# Patient Record
Sex: Female | Born: 1980 | Race: Black or African American | Hispanic: No | Marital: Single | Smoking: Never smoker
Health system: Southern US, Community
[De-identification: ages and names within clinical notes are randomized; demographics above are authoritative.]

## PROBLEM LIST (undated history)

## (undated) DIAGNOSIS — J45909 Unspecified asthma, uncomplicated: Secondary | ICD-10-CM

---

## 2013-01-08 ENCOUNTER — Emergency Department (HOSPITAL_BASED_OUTPATIENT_CLINIC_OR_DEPARTMENT_OTHER): Payer: Self-pay

## 2013-01-08 ENCOUNTER — Encounter (HOSPITAL_BASED_OUTPATIENT_CLINIC_OR_DEPARTMENT_OTHER): Payer: Self-pay | Admitting: Emergency Medicine

## 2013-01-08 ENCOUNTER — Emergency Department (HOSPITAL_BASED_OUTPATIENT_CLINIC_OR_DEPARTMENT_OTHER)
Admission: EM | Admit: 2013-01-08 | Discharge: 2013-01-08 | Disposition: A | Discharge status: Home / Self Care | Payer: Self-pay | Attending: Emergency Medicine | Admitting: Emergency Medicine

## 2013-01-08 DIAGNOSIS — J45909 Unspecified asthma, uncomplicated: Secondary | ICD-10-CM | POA: Insufficient documentation

## 2013-01-08 DIAGNOSIS — R509 Fever, unspecified: Secondary | ICD-10-CM | POA: Insufficient documentation

## 2013-01-08 DIAGNOSIS — R52 Pain, unspecified: Secondary | ICD-10-CM | POA: Insufficient documentation

## 2013-01-08 DIAGNOSIS — J111 Influenza due to unidentified influenza virus with other respiratory manifestations: Secondary | ICD-10-CM | POA: Insufficient documentation

## 2013-01-08 HISTORY — DX: Unspecified asthma, uncomplicated: J45.909

## 2013-01-08 MED ORDER — BENZONATATE 200 MG PO CAPS: 200.0000 mg | ORAL_CAPSULE | Freq: Three times a day (TID) | ORAL | Status: DC

## 2013-01-08 MED ORDER — ACETAMINOPHEN 325 MG PO TABS
ORAL_TABLET | ORAL | Status: AC
  Filled 2013-01-08: qty 2

## 2013-01-08 MED ORDER — ACETAMINOPHEN 325 MG PO TABS
650.0000 mg | ORAL_TABLET | Freq: Once | ORAL | Status: AC
  Administered 2013-01-08: 650 mg via ORAL

## 2013-01-08 MED ORDER — IBUPROFEN 800 MG PO TABS: 800.0000 mg | ORAL_TABLET | Freq: Three times a day (TID) | ORAL | Status: DC

## 2013-01-08 NOTE — ED Notes (Signed)
Karen Sofia, PA-C at bedside 

## 2013-01-08 NOTE — ED Provider Notes (Signed)
Medical screening examination/treatment/procedure(s) were performed by non-physician practitioner and as supervising physician I was immediately available for consultation/collaboration.  EKG Interpretation   None         Shanna Cisco, MD 01/08/13 1538

## 2013-01-08 NOTE — ED Notes (Signed)
Pt reports onset of cough, generalized body aches,fever, and vomiting.  Vomited 2 x in 24 hours.

## 2013-01-08 NOTE — ED Provider Notes (Signed)
CSN: [id number]     Arrival date & time 01/08/13  1219 History   First MD Initiated Contact with Patient 01/08/13 1303     Chief Complaint  Patient presents with  . Cough  . Fever  . Generalized Body Aches   (Consider location/radiation/quality/duration/timing/severity/associated sxs/prior Treatment) Patient is a 32 y.o. female presenting with cough and fever. The history is provided by the patient. No language interpreter was used.  Cough Cough characteristics:  Productive Sputum characteristics:  Nondescript Severity:  Moderate Onset quality:  Sudden Duration:  1 day Timing:  Constant Progression:  Worsening Chronicity:  New Relieved by:  Nothing Worsened by:  Nothing tried Ineffective treatments:  None tried Associated symptoms: fever   Fever Associated symptoms: cough     Past Medical History  Diagnosis Date  . Asthma    History reviewed. No pertinent past surgical history. No family history on file. History  Substance Use Topics  . Smoking status: Never Smoker   . Smokeless tobacco: Not on file  . Alcohol Use: Yes     Comment: occasional   OB History   Grav Para Term Preterm Abortions TAB SAB Ect Mult Living                 Review of Systems  Constitutional: Positive for fever.  Respiratory: Positive for cough.   All other systems reviewed and are negative.    Allergies  Asa  Home Medications   Current Outpatient Rx  Name  Route  Sig  Dispense  Refill  . benzonatate (TESSALON) 200 MG capsule   Oral   Take 1 capsule (200 mg total) by mouth every 8 (eight) hours.   21 capsule   0   . ibuprofen (ADVIL,MOTRIN) 800 MG tablet   Oral   Take 1 tablet (800 mg total) by mouth 3 (three) times daily.   21 tablet   0    BP 130/88  Pulse 118  Temp(Src) 99.3 F (37.4 C) (Oral)  Resp 16  Ht 5\' 8"  (1.727 m)  Wt 150 lb (68.04 kg)  BMI 22.81 kg/m2  SpO2 100%  LMP 12/24/2012 Physical Exam  Nursing note and vitals reviewed. Constitutional: She is  oriented to person, place, and time. She appears well-developed and well-nourished.  HENT:  Head: Normocephalic and atraumatic.  Right Ear: External ear normal.  Left Ear: External ear normal.  Eyes: Conjunctivae and EOM are normal. Pupils are equal, round, and reactive to light.  Neck: Normal range of motion.  Cardiovascular: Normal rate, regular rhythm and normal heart sounds.   Pulmonary/Chest: Effort normal and breath sounds normal.  Abdominal: Soft. She exhibits no distension.  Musculoskeletal: Normal range of motion.  Neurological: She is alert and oriented to person, place, and time.  Skin: Skin is warm.  Psychiatric: She has a normal mood and affect.    ED Course  Procedures (including critical care time) Labs Review Labs Reviewed - No data to display Imaging Review No results found.  EKG Interpretation   None       MDM   1. Influenza        Elson Areas, PA-C 01/08/13 1329  Lonia Skinner Dunn, PA-C 01/08/13 1329

## 2013-01-08 NOTE — ED Notes (Signed)
Patient transported to X-ray 

## 2014-11-16 ENCOUNTER — Emergency Department (HOSPITAL_BASED_OUTPATIENT_CLINIC_OR_DEPARTMENT_OTHER)
Admission: EM | Admit: 2014-11-16 | Discharge: 2014-11-16 | Disposition: A | Discharge status: Home / Self Care | Payer: No Typology Code available for payment source | Attending: Emergency Medicine | Admitting: Emergency Medicine

## 2014-11-16 ENCOUNTER — Encounter (HOSPITAL_BASED_OUTPATIENT_CLINIC_OR_DEPARTMENT_OTHER): Payer: Self-pay | Admitting: *Deleted

## 2014-11-16 DIAGNOSIS — Y998 Other external cause status: Secondary | ICD-10-CM | POA: Diagnosis not present

## 2014-11-16 DIAGNOSIS — Y9389 Activity, other specified: Secondary | ICD-10-CM | POA: Insufficient documentation

## 2014-11-16 DIAGNOSIS — Y92410 Unspecified street and highway as the place of occurrence of the external cause: Secondary | ICD-10-CM | POA: Insufficient documentation

## 2014-11-16 DIAGNOSIS — S79911A Unspecified injury of right hip, initial encounter: Secondary | ICD-10-CM | POA: Insufficient documentation

## 2014-11-16 DIAGNOSIS — Z79899 Other long term (current) drug therapy: Secondary | ICD-10-CM | POA: Diagnosis not present

## 2014-11-16 DIAGNOSIS — M545 Low back pain, unspecified: Secondary | ICD-10-CM

## 2014-11-16 DIAGNOSIS — S3992XA Unspecified injury of lower back, initial encounter: Secondary | ICD-10-CM | POA: Insufficient documentation

## 2014-11-16 DIAGNOSIS — Z791 Long term (current) use of non-steroidal anti-inflammatories (NSAID): Secondary | ICD-10-CM | POA: Insufficient documentation

## 2014-11-16 DIAGNOSIS — J45909 Unspecified asthma, uncomplicated: Secondary | ICD-10-CM | POA: Diagnosis not present

## 2014-11-16 DIAGNOSIS — M25551 Pain in right hip: Secondary | ICD-10-CM

## 2014-11-16 MED ORDER — METHOCARBAMOL 500 MG PO TABS: 500.0000 mg | ORAL_TABLET | Freq: Two times a day (BID) | ORAL | Status: AC | PRN

## 2014-11-16 MED ORDER — TRAMADOL HCL 50 MG PO TABS: 50.0000 mg | ORAL_TABLET | Freq: Four times a day (QID) | ORAL | Status: AC | PRN

## 2014-11-16 MED ORDER — IBUPROFEN 800 MG PO TABS: 800.0000 mg | ORAL_TABLET | Freq: Three times a day (TID) | ORAL | Status: DC

## 2014-11-16 NOTE — Discharge Instructions (Signed)
Back Pain, Adult Back pain is very common. The pain often gets better over time. The cause of back pain is usually not dangerous. Most people can learn to manage their back pain on their own.  HOME CARE  Watch your back pain for any changes. The following actions may help to lessen any pain you are feeling:  Stay active. Start with short walks on flat ground if you can. Try to walk farther each day.  Exercise regularly as told by your doctor. Exercise helps your back heal faster. It also helps avoid future injury by keeping your muscles strong and flexible.  Do not sit, drive, or stand in one place for more than 30 minutes.  Do not stay in bed. Resting more than 1-2 days can slow down your recovery.  Be careful when you bend or lift an object. Use good form when lifting:  Bend at your knees.  Keep the object close to your body.  Do not twist.  Sleep on a firm mattress. Lie on your side, and bend your knees. If you lie on your back, put a pillow under your knees.  Take medicines only as told by your doctor.  Put ice on the injured area.  Put ice in a plastic bag.  Place a towel between your skin and the bag.  Leave the ice on for 20 minutes, 2-3 times a day for the first 2-3 days. After that, you can switch between ice and heat packs.  Avoid feeling anxious or stressed. Find good ways to deal with stress, such as exercise.  Maintain a healthy weight. Extra weight puts stress on your back. GET HELP IF:   You have pain that does not go away with rest or medicine.  You have worsening pain that goes down into your legs or buttocks.  You have pain that does not get better in one week.  You have pain at night.  You lose weight.  You have a fever or chills. GET HELP RIGHT AWAY IF:   You cannot control when you poop (bowel movement) or pee (urinate).  Your arms or legs feel weak.  Your arms or legs lose feeling (numbness).  You feel sick to your stomach (nauseous) or  throw up (vomit).  You have belly (abdominal) pain.  You feel like you may pass out (faint).   This information is not intended to replace advice given to you by your health care provider. Make sure you discuss any questions you have with your health care provider.   Document Released: 06/14/2007 Document Revised: 01/16/2014 Document Reviewed: 04/29/2013 Elsevier Interactive Patient Education 2016 Elsevier Inc.  Cryotherapy Cryotherapy means treatment with cold. Ice or gel packs can be used to reduce both pain and swelling. Ice is the most helpful within the first 24 to 48 hours after an injury or flare-up from overusing a muscle or joint. Sprains, strains, spasms, burning pain, shooting pain, and aches can all be eased with ice. Ice can also be used when recovering from surgery. Ice is effective, has very few side effects, and is safe for most people to use. PRECAUTIONS  Ice is not a safe treatment option for people with:  Raynaud phenomenon. This is a condition affecting small blood vessels in the extremities. Exposure to cold may cause your problems to return.  Cold hypersensitivity. There are many forms of cold hypersensitivity, including:  Cold urticaria. Red, itchy hives appear on the skin when the tissues begin to warm after being iced.  Cold erythema. This is a red, itchy rash caused by exposure to cold.  Cold hemoglobinuria. Red blood cells break down when the tissues begin to warm after being iced. The hemoglobin that carry oxygen are passed into the urine because they cannot combine with blood proteins fast enough.  Numbness or altered sensitivity in the area being iced. If you have any of the following conditions, do not use ice until you have discussed cryotherapy with your caregiver:  Heart conditions, such as arrhythmia, angina, or chronic heart disease.  High blood pressure.  Healing wounds or open skin in the area being iced.  Current infections.  Rheumatoid  arthritis.  Poor circulation.  Diabetes. Ice slows the blood flow in the region it is applied. This is beneficial when trying to stop inflamed tissues from spreading irritating chemicals to surrounding tissues. However, if you expose your skin to cold temperatures for too long or without the proper protection, you can damage your skin or nerves. Watch for signs of skin damage due to cold. HOME CARE INSTRUCTIONS Follow these tips to use ice and cold packs safely.  Place a dry or damp towel between the ice and skin. A damp towel will cool the skin more quickly, so you may need to shorten the time that the ice is used.  For a more rapid response, add gentle compression to the ice.  Ice for no more than 10 to 20 minutes at a time. The bonier the area you are icing, the less time it will take to get the benefits of ice.  Check your skin after 5 minutes to make sure there are no signs of a poor response to cold or skin damage.  Rest 20 minutes or more between uses.  Once your skin is numb, you can end your treatment. You can test numbness by very lightly touching your skin. The touch should be so light that you do not see the skin dimple from the pressure of your fingertip. When using ice, most people will feel these normal sensations in this order: cold, burning, aching, and numbness.  Do not use ice on someone who cannot communicate their responses to pain, such as small children or people with dementia. HOW TO MAKE AN ICE PACK Ice packs are the most common way to use ice therapy. Other methods include ice massage, ice baths, and cryosprays. Muscle creams that cause a cold, tingly feeling do not offer the same benefits that ice offers and should not be used as a substitute unless recommended by your caregiver. To make an ice pack, do one of the following:  Place crushed ice or a bag of frozen vegetables in a sealable plastic bag. Squeeze out the excess air. Place this bag inside another plastic  bag. Slide the bag into a pillowcase or place a damp towel between your skin and the bag.  Mix 3 parts water with 1 part rubbing alcohol. Freeze the mixture in a sealable plastic bag. When you remove the mixture from the freezer, it will be slushy. Squeeze out the excess air. Place this bag inside another plastic bag. Slide the bag into a pillowcase or place a damp towel between your skin and the bag. SEEK MEDICAL CARE IF:  You develop white spots on your skin. This may give the skin a blotchy (mottled) appearance.  Your skin turns blue or pale.  Your skin becomes waxy or hard.  Your swelling gets worse. MAKE SURE YOU:   Understand these instructions.  Will  watch your condition.  Will get help right away if you are not doing well or get worse.   This information is not intended to replace advice given to you by your health care provider. Make sure you discuss any questions you have with your health care provider.   Document Released: 08/22/2010 Document Revised: 01/16/2014 Document Reviewed: 08/22/2010 Elsevier Interactive Patient Education 2016 Elsevier Inc.  [employer] Therapy Heat therapy can help ease sore, stiff, injured, and tight muscles and joints. Heat relaxes your muscles, which may help ease your pain.  RISKS AND COMPLICATIONS If you have any of the following conditions, do not use heat therapy unless your health care provider has approved:  Poor circulation.  Healing wounds or scarred skin in the area being treated.  Diabetes, heart disease, or high blood pressure.  Not being able to feel (numbness) the area being treated.  Unusual swelling of the area being treated.  Active infections.  Blood clots.  Cancer.  Inability to communicate pain. This may include young children and people who have problems with their brain function (dementia).  Pregnancy. Heat therapy should only be used on old, pre-existing, or long-lasting (chronic) injuries. Do not use heat  therapy on new injuries unless directed by your health care provider. HOW TO USE HEAT THERAPY There are several different kinds of heat therapy, including:  Moist heat pack.  Warm water bath.  Hot water bottle.  Electric heating pad.  Heated gel pack.  Heated wrap.  Electric heating pad. Use the heat therapy method suggested by your health care provider. Follow your health care provider's instructions on when and how to use heat therapy. GENERAL HEAT THERAPY RECOMMENDATIONS  Do not sleep while using heat therapy. Only use heat therapy while you are awake.  Your skin may turn pink while using heat therapy. Do not use heat therapy if your skin turns red.  Do not use heat therapy if you have new pain.  High heat or long exposure to heat can cause burns. Be careful when using heat therapy to avoid burning your skin.  Do not use heat therapy on areas of your skin that are already irritated, such as with a rash or sunburn. SEEK MEDICAL CARE IF:  You have blisters, redness, swelling, or numbness.  You have new pain.  Your pain is worse. MAKE SURE YOU:  Understand these instructions.  Will watch your condition.  Will get help right away if you are not doing well or get worse.   This information is not intended to replace advice given to you by your health care provider. Make sure you discuss any questions you have with your health care provider.   Document Released: 03/20/2011 Document Revised: 01/16/2014 Document Reviewed: 02/18/2013 Elsevier Interactive Patient Education 2016 Elsevier Inc.  Hip Pain Your hip is the joint between your upper legs and your lower pelvis. The bones, cartilage, tendons, and muscles of your hip joint perform a lot of work each day supporting your body weight and allowing you to move around. Hip pain can range from a minor ache to severe pain in one or both of your hips. Pain may be felt on the inside of the hip joint near the groin, or the  outside near the buttocks and upper thigh. You may have swelling or stiffness as well.  HOME CARE INSTRUCTIONS   Take medicines only as directed by your health care provider.  Apply ice to the injured area:  Put ice in a plastic bag.  Place a towel between your skin and the bag.  Leave the ice on for 15-20 minutes at a time, 3-4 times a day.  Keep your leg raised (elevated) when possible to lessen swelling.  Avoid activities that cause pain.  Follow specific exercises as directed by your health care provider.  Sleep with a pillow between your legs on your most comfortable side.  Record how often you have hip pain, the location of the pain, and what it feels like. SEEK MEDICAL CARE IF:   You are unable to put weight on your leg.  Your hip is red or swollen or very tender to touch.  Your pain or swelling continues or worsens after 1 week.  You have increasing difficulty walking.  You have a fever. SEEK IMMEDIATE MEDICAL CARE IF:   You have fallen.  You have a sudden increase in pain and swelling in your hip. MAKE SURE YOU:   Understand these instructions.  Will watch your condition.  Will get help right away if you are not doing well or get worse.   This information is not intended to replace advice given to you by your health care provider. Make sure you discuss any questions you have with your health care provider.   Document Released: 06/15/2009 Document Revised: 01/16/2014 Document Reviewed: 08/22/2012 Elsevier Interactive Patient Education 2016 [employer].  [employer]ist information centre manager It is common to have multiple bruises and sore muscles after a motor vehicle collision (MVC). These tend to feel worse for the first 24 hours. You may have the most stiffness and soreness over the first several hours. You may also feel worse when you wake up the first morning after your collision. After this point, you will usually begin to improve with each day. The speed of  improvement often depends on the severity of the collision, the number of injuries, and the location and nature of these injuries. HOME CARE INSTRUCTIONS  Put ice on the injured area.  Put ice in a plastic bag.  Place a towel between your skin and the bag.  Leave the ice on for 15-20 minutes, 3-4 times a day, or as directed by your health care provider.  Drink enough fluids to keep your urine clear or pale yellow. Do not drink alcohol.  Take a warm shower or bath once or twice a day. This will increase blood flow to sore muscles.  You may return to activities as directed by your caregiver. Be careful when lifting, as this may aggravate neck or back pain.  Only take over-the-counter or prescription medicines for pain, discomfort, or fever as directed by your caregiver. Do not use aspirin. This may increase bruising and bleeding. SEEK IMMEDIATE MEDICAL CARE IF:  You have numbness, tingling, or weakness in the arms or legs.  You develop severe headaches not relieved with medicine.  You have severe neck pain, especially tenderness in the middle of the back of your neck.  You have changes in bowel or bladder control.  There is increasing pain in any area of the body.  You have shortness of breath, light-headedness, dizziness, or fainting.  You have chest pain.  You feel sick to your stomach (nauseous), throw up (vomit), or sweat.  You have increasing abdominal discomfort.  There is blood in your urine, stool, or vomit.  You have pain in your shoulder (shoulder strap areas).  You feel your symptoms are getting worse. MAKE SURE YOU:  Understand these instructions.  Will watch your condition.  Will get  help right away if you are not doing well or get worse.   This information is not intended to replace advice given to you by your health care provider. Make sure you discuss any questions you have with your health care provider.   Document Released: 12/26/2004 Document  Revised: 01/16/2014 Document Reviewed: 05/25/2010 Elsevier Interactive Patient Education [employer] Elsevier Inc.
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
## 2015-08-19 IMAGING — CR DG CHEST 2V
2 series · 2 of 2 positions shown · non-contrast
Comparison: None.

CLINICAL DATA: 32-year-old female with cough fever and body ache.
Initial encounter.

EXAM:
CHEST  2 VIEW

[w chest pa]
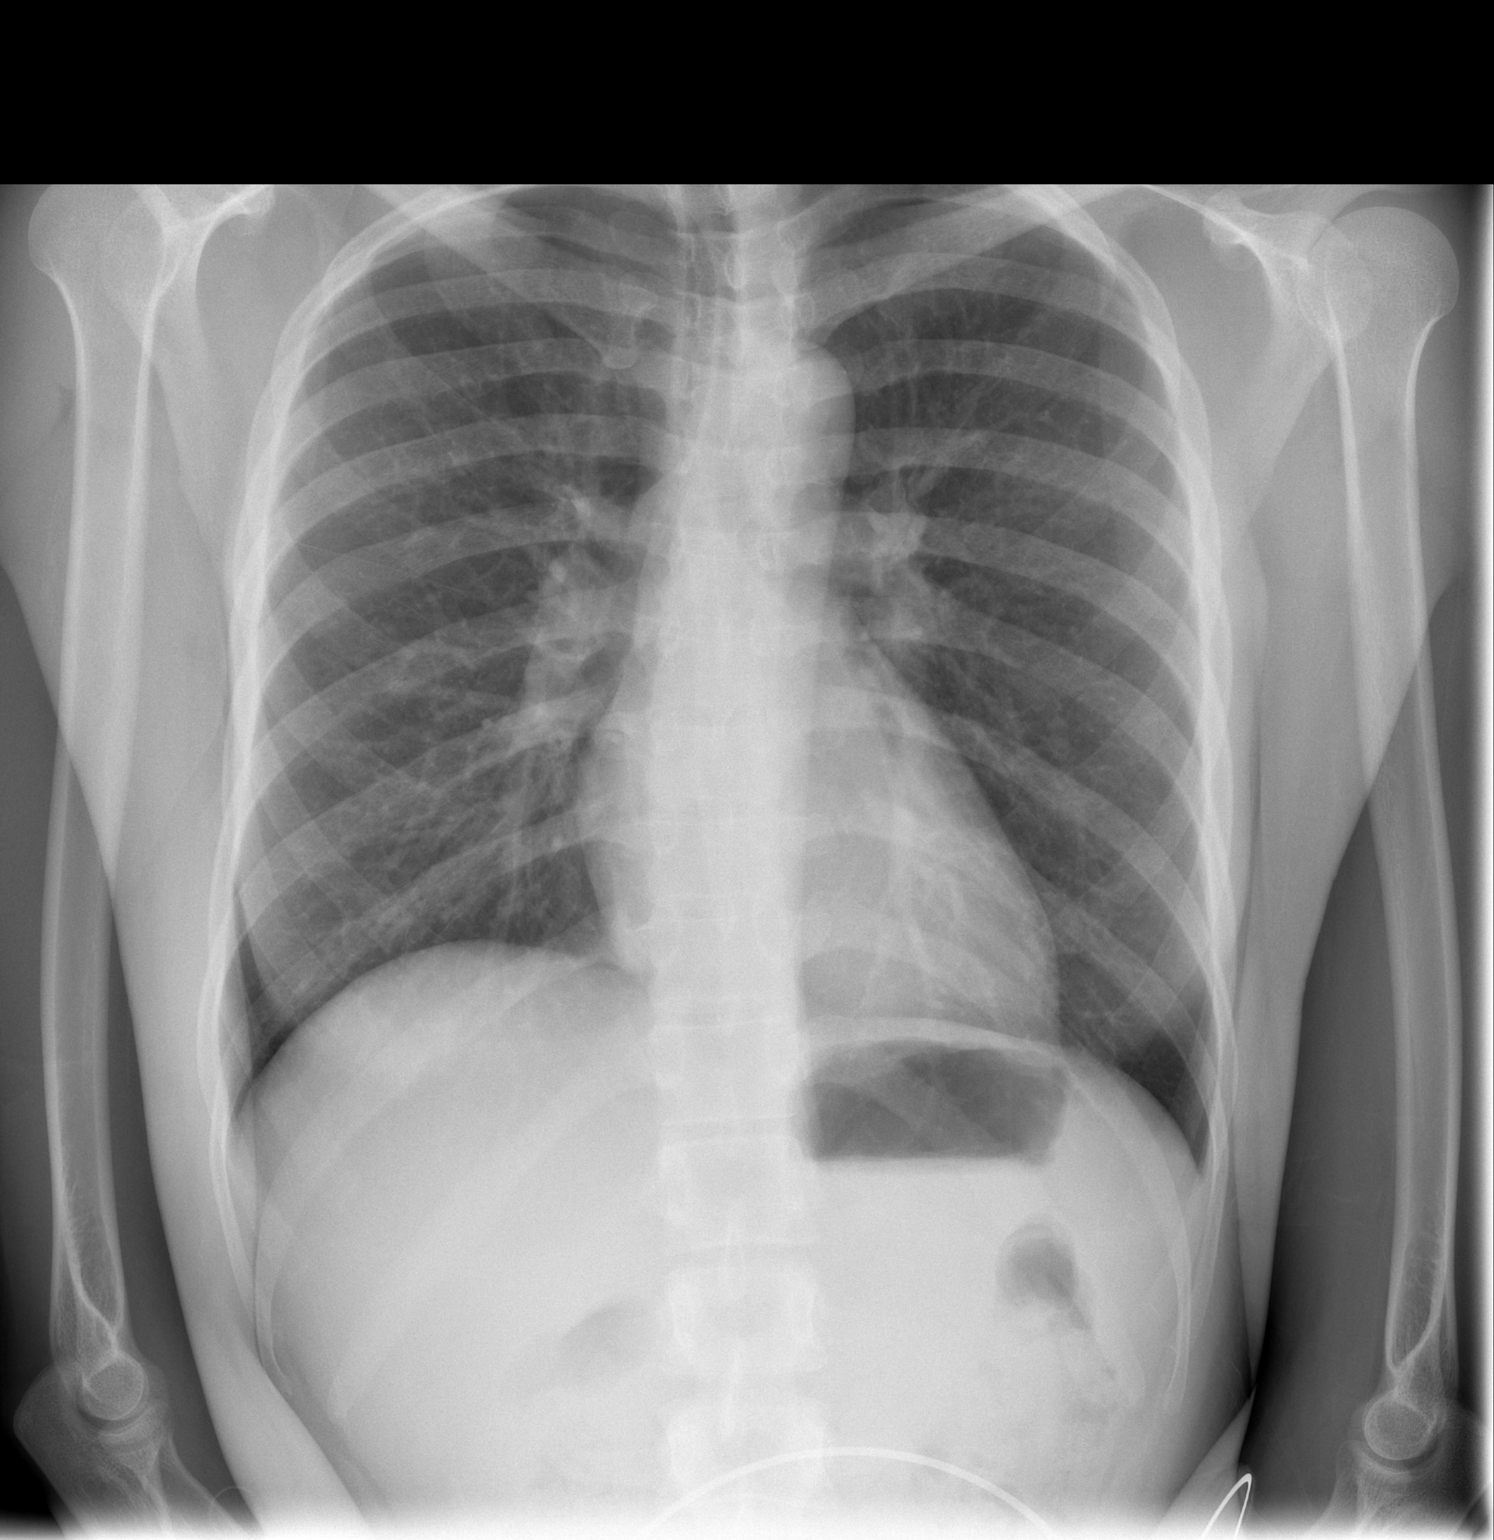

[w chest lat]
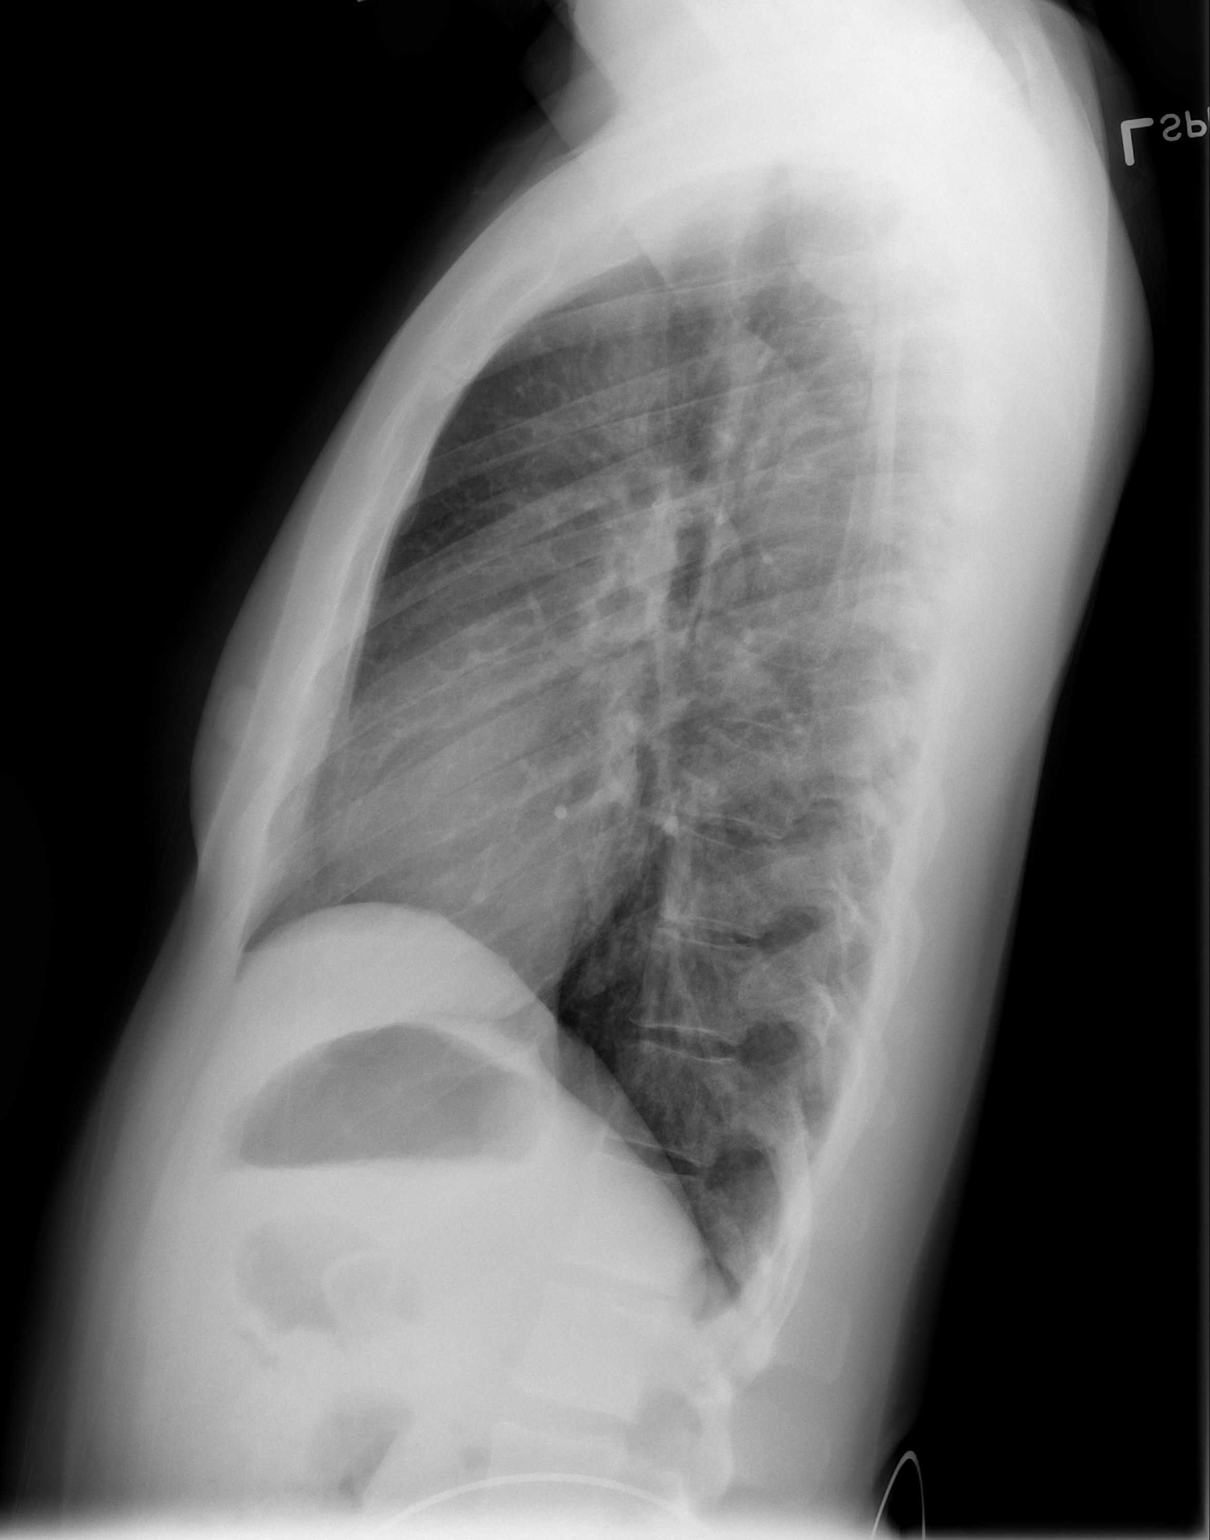

[2 of 2 positions shown; findings below may reference images not displayed]

FINDINGS: Lung volumes are within normal limits, mild elevation of the right
hemidiaphragm. Normal cardiac size and mediastinal contours.
Visualized tracheal air column is within normal limits. The lungs
are clear. No pneumothorax or effusion. Mild scoliosis. No acute
osseous abnormality identified.
IMPRESSION: No acute cardiopulmonary abnormality.

## 2020-09-07 ENCOUNTER — Encounter (HOSPITAL_BASED_OUTPATIENT_CLINIC_OR_DEPARTMENT_OTHER): Payer: Self-pay | Admitting: Emergency Medicine

## 2020-09-07 ENCOUNTER — Other Ambulatory Visit: Payer: Self-pay

## 2020-09-07 ENCOUNTER — Emergency Department (HOSPITAL_BASED_OUTPATIENT_CLINIC_OR_DEPARTMENT_OTHER)
Admission: EM | Admit: 2020-09-07 | Discharge: 2020-09-07 | Disposition: A | Discharge status: Home / Self Care | Payer: Commercial Managed Care - PPO | Attending: Emergency Medicine | Admitting: Emergency Medicine

## 2020-09-07 DIAGNOSIS — W57XXXA Bitten or stung by nonvenomous insect and other nonvenomous arthropods, initial encounter: Secondary | ICD-10-CM | POA: Diagnosis not present

## 2020-09-07 DIAGNOSIS — Z7951 Long term (current) use of inhaled steroids: Secondary | ICD-10-CM | POA: Insufficient documentation

## 2020-09-07 DIAGNOSIS — J45909 Unspecified asthma, uncomplicated: Secondary | ICD-10-CM | POA: Insufficient documentation

## 2020-09-07 DIAGNOSIS — S90561A Insect bite (nonvenomous), right ankle, initial encounter: Secondary | ICD-10-CM | POA: Diagnosis not present

## 2020-09-07 MED ORDER — BACITRACIN ZINC 500 UNIT/GM EX OINT: 1.0000 "application " | TOPICAL_OINTMENT | Freq: Two times a day (BID) | CUTANEOUS | 0 refills | Status: AC

## 2020-09-07 NOTE — Discharge Instructions (Signed)
Wash 2 times a day and apply antibiotic ointment after washing for the next 5-7 day.  Use tylenol, ibuprofen, and ice as needed.  Return to the ER if you develop fever, thick white pus draining, severe worsening pain, redness streaking up your leg, or any new, worsening, or concerning symptoms.

## 2020-09-07 NOTE — ED Triage Notes (Signed)
Reports right ankle pain.  Has flea bites to the ankle for the last week that has now gotten painful.  Reports some drainage.  Blister noted to area.

## 2020-09-07 NOTE — ED Provider Notes (Signed)
[facility] EMERGENCY DEPARTMENT Provider Note   CSN: [id number] Arrival date & time: 09/07/20  1126     History Chief Complaint  Patient presents with   Insect Bite    Teresa Williams is a 40 y.o. female presenting for evaluation of insect bite.   Pt states 7-10 days ago she sustained flea bites of her feet and ankles bilaterally. Multiple other family members got bites at the same time. Over the past few days, one lesion has grown more swollen and painful on her R ankle. No drainage. No fevers, chills, numbness, weakness, streaking. No recent prodromal symptoms. She is sexually active with 1 female partner who is symptom free. She has a h/o asthma for which she uses albuterol prn, no other medical problems. She has had impetigo following insect bites multiple times in the past.   HPI     Past Medical History:  Diagnosis Date   Asthma     There are no problems to display for this patient.   History reviewed. No pertinent surgical history.   OB History   No obstetric history on file.     No family history on file.  Social History   Tobacco Use   Smoking status: Never  Substance Use Topics   Alcohol use: Yes    Comment: occasional   Drug use: No    Home Medications Prior to Admission medications   Medication Sig Start Date End Date Taking? Authorizing Provider  bacitracin ointment Apply 1 application topically 2 (two) times daily. 09/07/20  Yes Chrystopher Stangl, PA-C  ALBUTEROL IN Inhale into the lungs.    [provider]  benzonatate (TESSALON) 200 MG capsule Take 1 capsule (200 mg total) by mouth every 8 (eight) hours. 01/08/13   Elson Areas, PA-C  ibuprofen (ADVIL,MOTRIN) 800 MG tablet Take 1 tablet (800 mg total) by mouth 3 (three) times daily. 11/16/14   Danelle Berry, PA-C  methocarbamol (ROBAXIN) 500 MG tablet Take 1 tablet (500 mg total) by mouth 2 (two) times daily as needed for muscle spasms. 11/16/14   Danelle Berry, PA-C  traMADol  (ULTRAM) 50 MG tablet Take 1 tablet (50 mg total) by mouth every 6 (six) hours as needed for severe pain. 11/16/14   Danelle Berry, PA-C    Allergies    Asa [aspirin]  Review of Systems   Review of Systems  Constitutional:  Negative for fever.  Skin:  Positive for rash.   Physical Exam Updated Vital Signs BP 118/79 (BP Location: Left Arm)   Pulse 80   Temp 98.5 F (36.9 C) (Oral)   Resp 16   Ht 5\' 8"  (1.727 m)   Wt 79.4 kg   SpO2 100%   BMI 26.61 kg/m   Physical Exam Vitals and nursing note reviewed.  Constitutional:      General: She is not in acute distress.    Appearance: She is well-developed.  HENT:     Head: Normocephalic and atraumatic.  Eyes:     Extraocular Movements: Extraocular movements intact.  Cardiovascular:     Rate and Rhythm: Normal rate.  Pulmonary:     Effort: Pulmonary effort is normal.  Abdominal:     General: There is no distension.  Musculoskeletal:        General: Normal range of motion.     Cervical back: Normal range of motion.  Skin:    General: Skin is warm.     Findings: Rash present.     Comments: See  pictures below. Blister of posterior R ankle, no surrounding erythema or induration. No active draiage. Multiple other discrete lesions c/w insect bites.   Neurological:     Mental Status: She is alert and oriented to person, place, and time.         ED Results / Procedures / Treatments   Labs (all labs ordered are listed, but only abnormal results are displayed) Labs Reviewed - No data to display  EKG None  Radiology No results found.  Procedures Procedures   Medications Ordered in ED Medications - No data to display  ED Course  I have reviewed the triage vital signs and the nursing notes.  Pertinent labs & imaging results that were available during my care of the patient were reviewed by me and considered in my medical decision making (see chart for details).    MDM Rules/Calculators/A&P                            Pt presenting for insect bite. On exam, pt appears nontoxic. Blister on the R heel. Blister was sterilely unroofed, and drained serous fluids. No streaking. Discussed wound care, bacitracin given. Rash not c/w with monkeypox, and pt without risk factors. No c/w RPR, SJS, RMSF. At this time, pt appears safe for d/c. Return precautions given. Pt states she understands and agrees to plan.    Final Clinical Impression(s) / ED Diagnoses Final diagnoses:  Insect bite of right ankle, initial encounter    Rx / DC Orders ED Discharge Orders          Ordered    bacitracin ointment  2 times daily        09/07/20 1223             Alveria Apley, PA-C 09/07/20 1231    Eber Hong, MD 09/18/20 [phone]

## 2020-12-06 ENCOUNTER — Encounter (HOSPITAL_BASED_OUTPATIENT_CLINIC_OR_DEPARTMENT_OTHER): Payer: Self-pay | Admitting: Urology

## 2020-12-06 ENCOUNTER — Other Ambulatory Visit: Payer: Self-pay

## 2020-12-06 ENCOUNTER — Emergency Department (HOSPITAL_BASED_OUTPATIENT_CLINIC_OR_DEPARTMENT_OTHER)
Admission: EM | Admit: 2020-12-06 | Discharge: 2020-12-06 | Disposition: A | Discharge status: Home / Self Care | Payer: Commercial Managed Care - PPO | Attending: Emergency Medicine | Admitting: Emergency Medicine

## 2020-12-06 DIAGNOSIS — J45909 Unspecified asthma, uncomplicated: Secondary | ICD-10-CM | POA: Diagnosis not present

## 2020-12-06 DIAGNOSIS — Z20822 Contact with and (suspected) exposure to covid-19: Secondary | ICD-10-CM | POA: Diagnosis not present

## 2020-12-06 DIAGNOSIS — J101 Influenza due to other identified influenza virus with other respiratory manifestations: Secondary | ICD-10-CM | POA: Insufficient documentation

## 2020-12-06 DIAGNOSIS — R509 Fever, unspecified: Secondary | ICD-10-CM | POA: Diagnosis present

## 2020-12-06 LAB — RESP PANEL BY RT-PCR (FLU A&B, COVID) ARPGX2
Influenza A by PCR: POSITIVE — AB
Influenza B by PCR: NEGATIVE
SARS Coronavirus 2 by RT PCR: NEGATIVE

## 2020-12-06 MED ORDER — BENZONATATE 100 MG PO CAPS: 100.0000 mg | ORAL_CAPSULE | Freq: Three times a day (TID) | ORAL | 0 refills | Status: AC | PRN

## 2020-12-06 MED ORDER — IBUPROFEN 800 MG PO TABS: 800.0000 mg | ORAL_TABLET | Freq: Three times a day (TID) | ORAL | 0 refills | Status: AC | PRN

## 2020-12-06 MED ORDER — FLUTICASONE PROPIONATE 50 MCG/ACT NA SUSP: 1.0000 | Freq: Every day | NASAL | 0 refills | Status: AC

## 2020-12-06 NOTE — ED Provider Notes (Signed)
Emergency Department Provider Note   I have reviewed the triage vital signs and the nursing notes.   HISTORY  Chief Complaint flu like symptoms   HPI Teresa Williams is a 40 y.o. female with PMH of asthma presents to the emergency department for evaluation of 3 days of fever along with chills and body aches.  Has had some associated cough.  Reports fever at home of 102.  Has been treating with Tylenol.  No abdominal pain, vomiting, diarrhea.  Denies chest pain, pleuritic or otherwise.  No leg swelling.   Past Medical History:  Diagnosis Date   Asthma     There are no problems to display for this patient.   History reviewed. No pertinent surgical history.  Allergies Asa [aspirin]  History reviewed. No pertinent family history.  Social History Social History   Tobacco Use   Smoking status: Never  Substance Use Topics   Alcohol use: Yes    Comment: occasional   Drug use: No    Review of Systems  Constitutional: Positive fever/chills and body aches.  Eyes: No visual changes. ENT: No sore throat. Cardiovascular: Denies chest pain. Respiratory: Denies shortness of breath. Positive cough.  Gastrointestinal: No abdominal pain.  No nausea, no vomiting.  No diarrhea.  No constipation. Genitourinary: Negative for dysuria. Musculoskeletal: Positive body aches.  Skin: Negative for rash. Neurological: Negative for focal weakness or numbness. Positive HA.   10-point ROS otherwise negative.  ____________________________________________   PHYSICAL EXAM:  VITAL SIGNS: ED Triage Vitals  Enc Vitals Group     BP 12/06/20 1810 130/77     Pulse Rate 12/06/20 1810 79     Resp 12/06/20 1810 18     Temp 12/06/20 1810 98.3 F (36.8 C)     Temp Source 12/06/20 1810 Oral     SpO2 12/06/20 1810 100 %     Weight 12/06/20 1809 200 lb (90.7 kg)     Height 12/06/20 1809 5\' 8"  (1.727 m)   Constitutional: Alert and oriented. Well appearing and in no acute distress. Eyes:  Conjunctivae are normal.  Head: Atraumatic. Nose: No congestion/rhinnorhea. Mouth/Throat: Mucous membranes are moist.  Oropharynx non-erythematous. Neck: No stridor.   Cardiovascular: Normal rate, regular rhythm. Good peripheral circulation. Grossly normal heart sounds.   Respiratory: Normal respiratory effort.  No retractions. Lungs CTAB. Gastrointestinal: Soft and nontender. No distention.  Musculoskeletal: No lower extremity tenderness nor edema. No gross deformities of extremities. Neurologic:  Normal speech and language. No gross focal neurologic deficits are appreciated.  Skin:  Skin is warm, dry and intact. No rash noted.   ____________________________________________   LABS (all labs ordered are listed, but only abnormal results are displayed)  Labs Reviewed  RESP PANEL BY RT-PCR (FLU A&B, COVID) ARPGX2 - Abnormal; Notable for the following components:      Result Value   Influenza A by PCR POSITIVE (*)    All other components within normal limits    ____________________________________________   PROCEDURES  Procedure(s) performed:   Procedures  None  ____________________________________________   INITIAL IMPRESSION / ASSESSMENT AND PLAN / ED COURSE  Pertinent labs & imaging results that were available during my care of the patient were reviewed by me and considered in my medical decision making (see chart for details).   Patient presents emergency department with flulike symptoms over the past 3 days.  He was tested for COVID and flu in the triage process and has resulted positive for flu A.  Negative for COVID.  Vital signs within normal limits including room air oxygen saturation of 100%.  No tachypnea.  No significant wheezing.  Afebrile here.  Looks very well.  Discussed risk/benefit of Tamiflu along with side effect profile.  Will defer this medication on day 3 of symptoms.  Plan for other supportive care medications at home and PCP follow-up plan.  Discussed  quarantine and ED return precautions.   ____________________________________________  FINAL CLINICAL IMPRESSION(S) / ED DIAGNOSES  Final diagnoses:  Influenza A     NEW OUTPATIENT MEDICATIONS STARTED DURING THIS VISIT:  Discharge Medication List as of 12/06/2020 10:44 PM     START taking these medications   Details  fluticasone (FLONASE) 50 MCG/ACT nasal spray Place 1 spray into both nostrils daily for 7 days., Starting Mon 12/06/2020, Until Mon 12/13/2020, Normal        Note:  This document was prepared using Dragon voice recognition software and may include unintentional dictation errors.  Alona Bene, MD, [facility]    Matheo Rathbone, Arlyss Repress, MD 12/09/20 [phone]

## 2020-12-06 NOTE — ED Triage Notes (Signed)
Fever, chills, body aches, and cough that started 3 days ago Fever of 102 at home Tylenol at 1200.

## 2020-12-06 NOTE — Discharge Instructions (Signed)
